# Patient Record
Sex: Male | Born: 1965 | Race: White | Hispanic: No | Marital: Married | Smoking: Never smoker
Health system: Southern US, Community
[De-identification: ages and names within clinical notes are randomized; demographics above are authoritative.]

## PROBLEM LIST (undated history)

## (undated) HISTORY — PX: SHOULDER SURGERY: SHX246

---

## 2008-01-19 ENCOUNTER — Ambulatory Visit: Payer: Self-pay | Admitting: Family Medicine

## 2010-04-17 ENCOUNTER — Ambulatory Visit: Payer: Self-pay

## 2010-09-18 ENCOUNTER — Ambulatory Visit: Payer: Self-pay

## 2011-06-10 ENCOUNTER — Emergency Department: Payer: Self-pay | Admitting: Emergency Medicine

## 2014-06-02 ENCOUNTER — Ambulatory Visit: Payer: 59 | Admitting: Podiatry

## 2014-06-05 ENCOUNTER — Ambulatory Visit: Payer: 59 | Admitting: Podiatry

## 2014-06-19 ENCOUNTER — Ambulatory Visit: Payer: 59 | Admitting: Podiatry

## 2014-06-23 ENCOUNTER — Ambulatory Visit: Payer: 59 | Admitting: Podiatry

## 2014-07-10 ENCOUNTER — Ambulatory Visit: Payer: 59 | Admitting: Podiatry

## 2015-11-09 ENCOUNTER — Emergency Department: Payer: BLUE CROSS/BLUE SHIELD

## 2015-11-09 ENCOUNTER — Emergency Department
Admission: EM | Admit: 2015-11-09 | Discharge: 2015-11-09 | Disposition: A | Discharge status: Home / Self Care | Payer: BLUE CROSS/BLUE SHIELD | Attending: Emergency Medicine | Admitting: Emergency Medicine

## 2015-11-09 DIAGNOSIS — Y92830 Public park as the place of occurrence of the external cause: Secondary | ICD-10-CM | POA: Diagnosis not present

## 2015-11-09 DIAGNOSIS — Y999 Unspecified external cause status: Secondary | ICD-10-CM | POA: Insufficient documentation

## 2015-11-09 DIAGNOSIS — X509XXA Other and unspecified overexertion or strenuous movements or postures, initial encounter: Secondary | ICD-10-CM | POA: Insufficient documentation

## 2015-11-09 DIAGNOSIS — Y9344 Activity, trampolining: Secondary | ICD-10-CM | POA: Insufficient documentation

## 2015-11-09 DIAGNOSIS — S86912A Strain of unspecified muscle(s) and tendon(s) at lower leg level, left leg, initial encounter: Secondary | ICD-10-CM | POA: Diagnosis not present

## 2015-11-09 DIAGNOSIS — S76912A Strain of unspecified muscles, fascia and tendons at thigh level, left thigh, initial encounter: Secondary | ICD-10-CM | POA: Diagnosis not present

## 2015-11-09 DIAGNOSIS — S8992XA Unspecified injury of left lower leg, initial encounter: Secondary | ICD-10-CM | POA: Diagnosis present

## 2015-11-09 DIAGNOSIS — S8392XA Sprain of unspecified site of left knee, initial encounter: Secondary | ICD-10-CM

## 2015-11-09 MED ORDER — IBUPROFEN 600 MG PO TABS: 600.0000 mg | ORAL_TABLET | Freq: Four times a day (QID) | ORAL | Status: AC | PRN

## 2015-11-09 NOTE — Discharge Instructions (Signed)
Elastic Bandage and RICE WHAT DOES AN ELASTIC BANDAGE DO? Elastic bandages come in different shapes and sizes. They generally provide support to your injury and reduce swelling while you are healing, but they can perform different functions. Your health care provider will help you to decide what is best for your protection, recovery, or rehabilitation following an injury. WHAT ARE SOME GENERAL TIPS FOR USING AN ELASTIC BANDAGE?  Use the bandage as directed by the maker of the bandage that you are using.  Do not wrap the bandage too tightly. This may cut off the circulation in the arm or leg in the area below the bandage.  If part of your body beyond the bandage becomes blue, numb, cold, swollen, or is more painful, your bandage is most likely too tight. If this occurs, remove your bandage and reapply it more loosely.  See your health care provider if the bandage seems to be making your problems worse rather than better.  An elastic bandage should be removed and reapplied every 3-4 hours or as directed by your health care provider. WHAT IS RICE? The routine care of many injuries includes rest, ice, compression, and elevation (RICE therapy).  Rest Rest is required to allow your body to heal. Generally, you can resume your routine activities when you are comfortable and have been given permission by your health care provider. Ice Icing your injury helps to keep the swelling down and it reduces pain. Do not apply ice directly to your skin.  Put ice in a plastic bag.  Place a towel between your skin and the bag.  Leave the ice on for 20 minutes, 2-3 times per day. Do this for as long as you are directed by your health care provider. Compression Compression helps to keep swelling down, gives support, and helps with discomfort. Compression may be done with an elastic bandage. Elevation Elevation helps to reduce swelling and it decreases pain. If possible, your injured area should be placed at  or above the level of your heart or the center of your chest. WHEN SHOULD I SEEK MEDICAL CARE? You should seek medical care if:  You have persistent pain and swelling.  Your symptoms are getting worse rather than improving. These symptoms may indicate that further evaluation or further X-rays are needed. Sometimes, X-rays may not show a small broken bone (fracture) until a number of days later. Make a follow-up appointment with your health care provider. Ask when your X-ray results will be ready. Make sure that you get your X-ray results. WHEN SHOULD I SEEK IMMEDIATE MEDICAL CARE? You should seek immediate medical care if:  You have a sudden onset of severe pain at or below the area of your injury.  You develop redness or increased swelling around your injury.  You have tingling or numbness at or below the area of your injury that does not improve after you remove the elastic bandage.   This information is not intended to replace advice given to you by your health care provider. Make sure you discuss any questions you have with your health care provider.   Document Released: 01/06/2002 Document Revised: 04/07/2015 Document Reviewed: 03/02/2014 Elsevier Interactive Patient Education 2016 [employer]  Knee Sprain A knee sprain is a tear in one of the strong, fibrous tissues that connect the bones (ligaments) in your knee. The severity of the sprain depends on how much of the ligament is torn. The tear can be either partial or complete. CAUSES  Often, sprains are a  result of a fall or injury. The force of the impact causes the fibers of your ligament to stretch too much. This excess tension causes the fibers of your ligament to tear. SIGNS AND SYMPTOMS  You may have some loss of motion in your knee. Other symptoms include:  Bruising.  Pain in the knee area.  Tenderness of the knee to the touch.  Swelling. DIAGNOSIS  To diagnose a knee sprain, your health care provider will  physically examine your knee. Your health care provider may also suggest an X-ray exam of your knee to make sure no bones are broken. TREATMENT  If your ligament is only partially torn, treatment usually involves keeping the knee in a fixed position (immobilization) or bracing your knee for activities that require movement for several weeks. To do this, your health care provider will apply a bandage, cast, or splint to keep your knee from moving and to support your knee during movement until it heals. For a partially torn ligament, the healing process usually takes 4-6 weeks. If your ligament is completely torn, depending on which ligament it is, you may need surgery to reconnect the ligament to the bone or reconstruct it. After surgery, a cast or splint may be applied and will need to stay on your knee for 4-6 weeks while your ligament heals. HOME CARE INSTRUCTIONS  Keep your injured knee elevated to decrease swelling.  To ease pain and swelling, apply ice to the injured area:  Put ice in a plastic bag.  Place a towel between your skin and the bag.  Leave the ice on for 20 minutes, 2-3 times a day.  Only take medicine for pain as directed by your health care provider.  Do not leave your knee unprotected until pain and stiffness go away (usually 4-6 weeks).  If you have a cast or splint, do not allow it to get wet. If you have been instructed not to remove it, cover it with a plastic bag when you shower or bathe. Do not swim.  Your health care provider may suggest exercises for you to do during your recovery to prevent or limit permanent weakness and stiffness. SEEK IMMEDIATE MEDICAL CARE IF:  Your cast or splint becomes damaged.  Your pain becomes worse.  You have significant pain, swelling, or numbness below the cast or splint. MAKE SURE YOU:  Understand these instructions.  Will watch your condition.  Will get help right away if you are not doing well or get worse.   This  information is not intended to replace advice given to you by your health care provider. Make sure you discuss any questions you have with your health care provider.   Document Released: 07/17/2005 Document Revised: 08/07/2014 Document Reviewed: 02/26/2013 Elsevier Interactive Patient Education 2016 [employer]  Muscle Strain A muscle strain is an injury that occurs when a muscle is stretched beyond its normal length. Usually a small number of muscle fibers are torn when this happens. Muscle strain is rated in degrees. First-degree strains have the least amount of muscle fiber tearing and pain. Second-degree and third-degree strains have increasingly more tearing and pain.  Usually, recovery from muscle strain takes 1-2 weeks. Complete healing takes 5-6 weeks.  CAUSES  Muscle strain happens when a sudden, violent force placed on a muscle stretches it too far. This may occur with lifting, sports, or a fall.  RISK FACTORS Muscle strain is especially common in athletes.  SIGNS AND SYMPTOMS At the site of the muscle  strain, there may be:  Pain.  Bruising.  Swelling.  Difficulty using the muscle due to pain or lack of normal function. DIAGNOSIS  Your health care provider will perform a physical exam and ask about your medical history. TREATMENT  Often, the best treatment for a muscle strain is resting, icing, and applying cold compresses to the injured area.  HOME CARE INSTRUCTIONS   Use the PRICE method of treatment to promote muscle healing during the first 2-3 days after your injury. The PRICE method involves:  Protecting the muscle from being injured again.  Restricting your activity and resting the injured body part.  Icing your injury. To do this, put ice in a plastic bag. Place a towel between your skin and the bag. Then, apply the ice and leave it on from 15-20 minutes each hour. After the third day, switch to moist heat packs.  Apply compression to the injured area with a  splint or elastic bandage. Be careful not to wrap it too tightly. This may interfere with blood circulation or increase swelling.  Elevate the injured body part above the level of your heart as often as you can.  Only take over-the-counter or prescription medicines for pain, discomfort, or fever as directed by your health care provider.  Warming up prior to exercise helps to prevent future muscle strains. SEEK MEDICAL CARE IF:   You have increasing pain or swelling in the injured area.  You have numbness, tingling, or a significant loss of strength in the injured area. MAKE SURE YOU:   Understand these instructions.  Will watch your condition.  Will get help right away if you are not doing well or get worse.   This information is not intended to replace advice given to you by your health care provider. Make sure you discuss any questions you have with your health care provider.   Document Released: 07/17/2005 Document Revised: 05/07/2013 Document Reviewed: 02/13/2013 Elsevier Interactive Patient Education [employer]

## 2015-11-09 NOTE — ED Notes (Signed)
Pt transported to xray via stretcher

## 2015-11-09 NOTE — ED Notes (Addendum)
Pt to treatment room in wheelchair. Pt states he was at trampoline park with sons earlier and landed on legs, heard pop and legs gave out. Swelling noted to outside upper knee, bruise noted to upper inner thigh. Pt states pain with movement. States pain similar to a muscle tear that he experienced in college.

## 2015-11-09 NOTE — ED Provider Notes (Signed)
[facility] Emergency Department Provider Note  ____________________________________________  Time seen: Approximately 8:00 PM  I have reviewed the triage vital signs and the nursing notes.   HISTORY  Chief Complaint Leg Injury    HPI Blake Chandler is a 50 y.o. male , NAD, presents to the emergency department with a couple of hours history of left thigh and knee pain. He states that he was at a trampoline park with his children where he was trying to "jump as high as he could", when he landed and both of his knees buckled. He heard a pop and struggled to walk to the side of the trampolines.  He states he cannot walk on the left leg with out pain. He states he has pain on the anterior thigh, medial and lateral knee. He placed ice on the left knee and took ibuprofen for the pain with minimal relief.    History reviewed. No pertinent past medical history.  There are no active problems to display for this patient.   Past Surgical History  Procedure Laterality Date  . Shoulder surgery      Current Outpatient Rx  Name  Route  Sig  Dispense  Refill  . ibuprofen (ADVIL,MOTRIN) 600 MG tablet   Oral   Take 1 tablet (600 mg total) by mouth every 6 (six) hours as needed.   30 tablet   0     Allergies Review of patient's allergies indicates no known allergies.  No family history on file.  Social History Social History  Substance Use Topics  . Smoking status: Never Smoker   . Smokeless tobacco: None  . Alcohol Use: No     Review of Systems  Constitutional: No fever/chills, fatigue Eyes: No visual changes.  Cardiovascular: No chest pain. Respiratory: No cough. No shortness of breath. No wheezing.  Gastrointestinal: No abdominal pain.  No nausea, vomiting.   Musculoskeletal: Positive for left quadriceps and knee pain. Negative for back and neck pain.  Skin: Positive knee swelling. Negative for rash, open wounds. Neurological: Negative for headaches,  focal weakness or numbness. No saddle paresthesias. No tingling. 10-point ROS otherwise negative.  ____________________________________________   PHYSICAL EXAM:  VITAL SIGNS: ED Triage Vitals  Enc Vitals Group     BP 11/09/15 1945 130/100 mmHg     Pulse Rate 11/09/15 1945 82     Resp 11/09/15 1945 16     Temp 11/09/15 1945 98.8 F (37.1 C)     Temp Source 11/09/15 1945 Oral     SpO2 11/09/15 1945 94 %     Weight 11/09/15 1945 180 lb (81.647 kg)     Height 11/09/15 1945  (1.778 m)     Head Cir --      Peak Flow --      Pain Score 11/09/15 1946 7     Pain Loc --      Pain Edu? --      Excl. in GC? --     Constitutional: Alert and oriented. Well appearing and in no acute distress. Head: Atraumatic. Cardiovascular: Good peripheral circulation with 2+ pulses noted in the left lower extremity. Respiratory: Normal respiratory effort without tachypnea or retractions. Musculoskeletal: No deformity noted. Mild edema noted on the medial and superior sides of the left knee. TTP about the left quadriceps diffusely. Full passive ROM with mild pain to the left quadriceps. Negative bilateral straight leg raise. Neurologic:  Normal speech and language. No gross focal neurologic deficits are appreciated.  Skin: Mild ecchymosis noted on the left lateral knee.  Skin is warm, dry and intact. No rash noted. Psychiatric: Mood and affect are normal. Speech and behavior are normal. Patient exhibits appropriate insight and judgement.   ____________________________________________   LABS  None  ____________________________________________  RADIOLOGY I have personally viewed and evaluated these images (plain radiographs) as part of my medical decision making, as well as reviewing the written report by the radiologist.  Dg Pelvis 1-2 Views  11/09/2015  CLINICAL DATA:  Pain following running, initial encounter EXAM: PELVIS - 1-2 VIEW COMPARISON:  None. FINDINGS: There is no evidence of  pelvic fracture or diastasis. No pelvic bone lesions are seen. IMPRESSION: No acute abnormality noted. Electronically Signed   By: Alcide CleverMark  Lukens M.D.   On: 11/09/2015 20:57   Dg Knee Complete 4 Views Left  11/09/2015  CLINICAL DATA:  Knee pain following running, initial encounter EXAM: LEFT KNEE - COMPLETE 4+ VIEW COMPARISON:  None. FINDINGS: No acute fracture or dislocation is noted. There is soft tissue swelling and thickening of the suprapatellar ligament raising suspicion of injury. No other soft tissue abnormality is noted. IMPRESSION: Possible suprapatellar ligament injury. No acute bony abnormality is seen. Electronically Signed   By: Alcide CleverMark  Lukens M.D.   On: 11/09/2015 20:57   Dg Femur Min 2 Views Left  11/09/2015  CLINICAL DATA:  Left leg pain following running, initial encounter EXAM: LEFT FEMUR 2 VIEWS COMPARISON:  None. FINDINGS: No acute bony abnormality is noted. There is again soft tissue swelling in the suprapatellar region suggestive of ligamentous injury. IMPRESSION: Soft tissue changes along the suprapatellar region. No bony abnormality is seen. Electronically Signed   By: Alcide CleverMark  Lukens M.D.   On: 11/09/2015 20:58    ____________________________________________    PROCEDURES  Procedure(s) performed: None   Medications - No data to display   ____________________________________________   INITIAL IMPRESSION / ASSESSMENT AND PLAN / ED COURSE  Pertinent imaging results that were available during my care of the patient were reviewed by me and considered in my medical decision making (see chart for details).  Patient's diagnosis is consistent with left thigh and knee sprain. Patient will be discharged home with prescriptions for ibuprofen 600mg  to take as directed. Patient's left knee and thigh wrapped in an Ace wrap and he was given crutches to assist with ambulation. Patient is to follow up with Dr. Joice LoftsPoggi in orthopedics for continued assessment and management. Patient is to  follow up with PCP if symptoms persist past this treatment course. Patient is given ED precautions to return to the ED for any worsening or new symptoms.      ____________________________________________  FINAL CLINICAL IMPRESSION(S) / ED DIAGNOSES  Final diagnoses:  Muscle strain of left thigh, initial encounter  Knee sprain and strain, left, initial encounter      NEW MEDICATIONS STARTED DURING THIS VISIT:  Discharge Medication List as of 11/09/2015  9:20 PM    START taking these medications   Details  ibuprofen (ADVIL,MOTRIN) 600 MG tablet Take 1 tablet (600 mg total) by mouth every 6 (six) hours as needed., Starting 11/09/2015, Until Discontinued, Print             Hope PigeonJami L Sejla Marzano, PA-C 11/09/15 2153  Phineas SemenGraydon Goodman, MD 11/11/15 1541

## 2015-11-09 NOTE — ED Notes (Signed)
Pt arrives to ER c/o left leg pain. Pt at trampoline park P[place] and injured upper and outer left leg. Pt unable to lift leg. No obvious deformity. Pt alert and oriented X4, active, cooperative, pt in NAD. RR even and unlabored, color WNL.

## 2016-02-28 ENCOUNTER — Ambulatory Visit (INDEPENDENT_AMBULATORY_CARE_PROVIDER_SITE_OTHER): Payer: BLUE CROSS/BLUE SHIELD | Admitting: Sports Medicine

## 2016-02-28 ENCOUNTER — Encounter: Payer: Self-pay | Admitting: Sports Medicine

## 2016-02-28 DIAGNOSIS — S76112A Strain of left quadriceps muscle, fascia and tendon, initial encounter: Secondary | ICD-10-CM | POA: Insufficient documentation

## 2016-02-28 NOTE — Assessment & Plan Note (Signed)
Patient is here with complaints of continued pain/discomfort after suffering a 2+ quadriceps muscle tear. Ultrasound yielded evidence of significant hematoma still present deep to the rectus femoris, lateral to the midline. No evidence of quadriceps tendon compromise. - Encouraged improve compliance with physical therapy. - Fitted for left thigh compression sleeve.

## 2016-02-28 NOTE — Progress Notes (Signed)
   HPI  CC: Left thigh pain Patient is here with complaints of continued left thigh pain. He states that back in April he suffered an injury while at the trampoline park with his 2 sons. At that time he had experienced a sharp popping sensation in his left thigh followed by severe pain. He states that he had some initial swelling and ecchymoses of this area. He was seen at [facility] orthopedics and an MRI was performed. According to the patient that MRI revealed no abnormalities within his knee however it showed a 2+ quadricep tear.  Since that time patient had been referred to physical therapy. He had undergone 2 sessions of physical therapy but then stopped showing up due to beliefs that he could carry on those exercises at home.  Patient continues to experience pain in the anterior lateral aspect of his left thigh. He also endorses concern for atrophy of the affected thigh. He experiences some occasional "buckling" of the affected leg especially when using stairs. The initial swelling and ecchymoses has since resolved.  Review of Systems   See HPI for ROS. All other systems reviewed and are negative.  CC, SH/smoking status, and VS noted  Objective: BP 120/78   Pulse 62   Ht 5\' 11"  (1.803 m)   Wt 184 lb (83.5 kg)   BMI 25.66 kg/m  Gen: NAD, alert, cooperative, and pleasant. Knee/Thigh, Left:  Knee demonstrates full range of motion. No effusion.  Good ligamentous stability. Extensor mechanism is intact. There are 2 separate areas of palpable induration along the lateral thigh. There is one area in the mid thigh and a separate smaller area a little more distal just superior to the patella.  These areas are slightly tender to palpation. There is some slight atrophy of the left thigh when compared to the right. He is neurovascularly intact distally.  Ultrasound: Left Knee and Thigh: Limited US performed.Quadriceps and patellar tendon are intact without abnormalities. Minimal compressible  effusion noted in suprapatellar pouch. Medial and lateral meniscus visualized and without obvious abnormalities. Notable hypoechoic pocket visualized deep to the rectus femoris just lateral to the midline of the thigh involving the vastus lateralis at this level. There is a smaller yet identical area a little more distally.  These changes are consistent with resolving hematomas likely as the result of a partial quad tear here.   Assessment and plan:  Strain of left quadriceps muscle Patient is here with complaints of continued pain/discomfort after suffering a 2+ quadriceps muscle tear. Ultrasound yielded evidence of significant hematoma still present deep to the rectus femoris, lateral to the midline. No evidence of quadriceps tendon compromise. - Encouraged improve compliance with physical therapy. - Fitted for left thigh compression sleeve.   Kathee Delton, MD,MS,  PGY3 02/28/2016 10:16 AM    Patient was seen and evaluated with the resident. I agree with the above plan of care. Ultrasound evaluation reveals 2 separate areas of hematoma within the vastus lateralis muscle. I explained to the patient that it may be another 3 months or so before hematoma resolution is seen. I recommended compression with activity and a return to physical therapy. Patient will follow-up with Dr. Thurston Hole per Dr. Sherene Sires request. Follow-up with [place] as needed.

## 2017-03-26 ENCOUNTER — Ambulatory Visit: Payer: BLUE CROSS/BLUE SHIELD | Admitting: Sports Medicine

## 2017-08-15 IMAGING — CR DG PELVIS 1-2V
1 series · 1 of 1 positions shown · non-contrast
Comparison: None.

CLINICAL DATA: Pain following running, initial encounter

EXAM:
PELVIS - 1-2 VIEW

[dg pelvis 1-2 views]
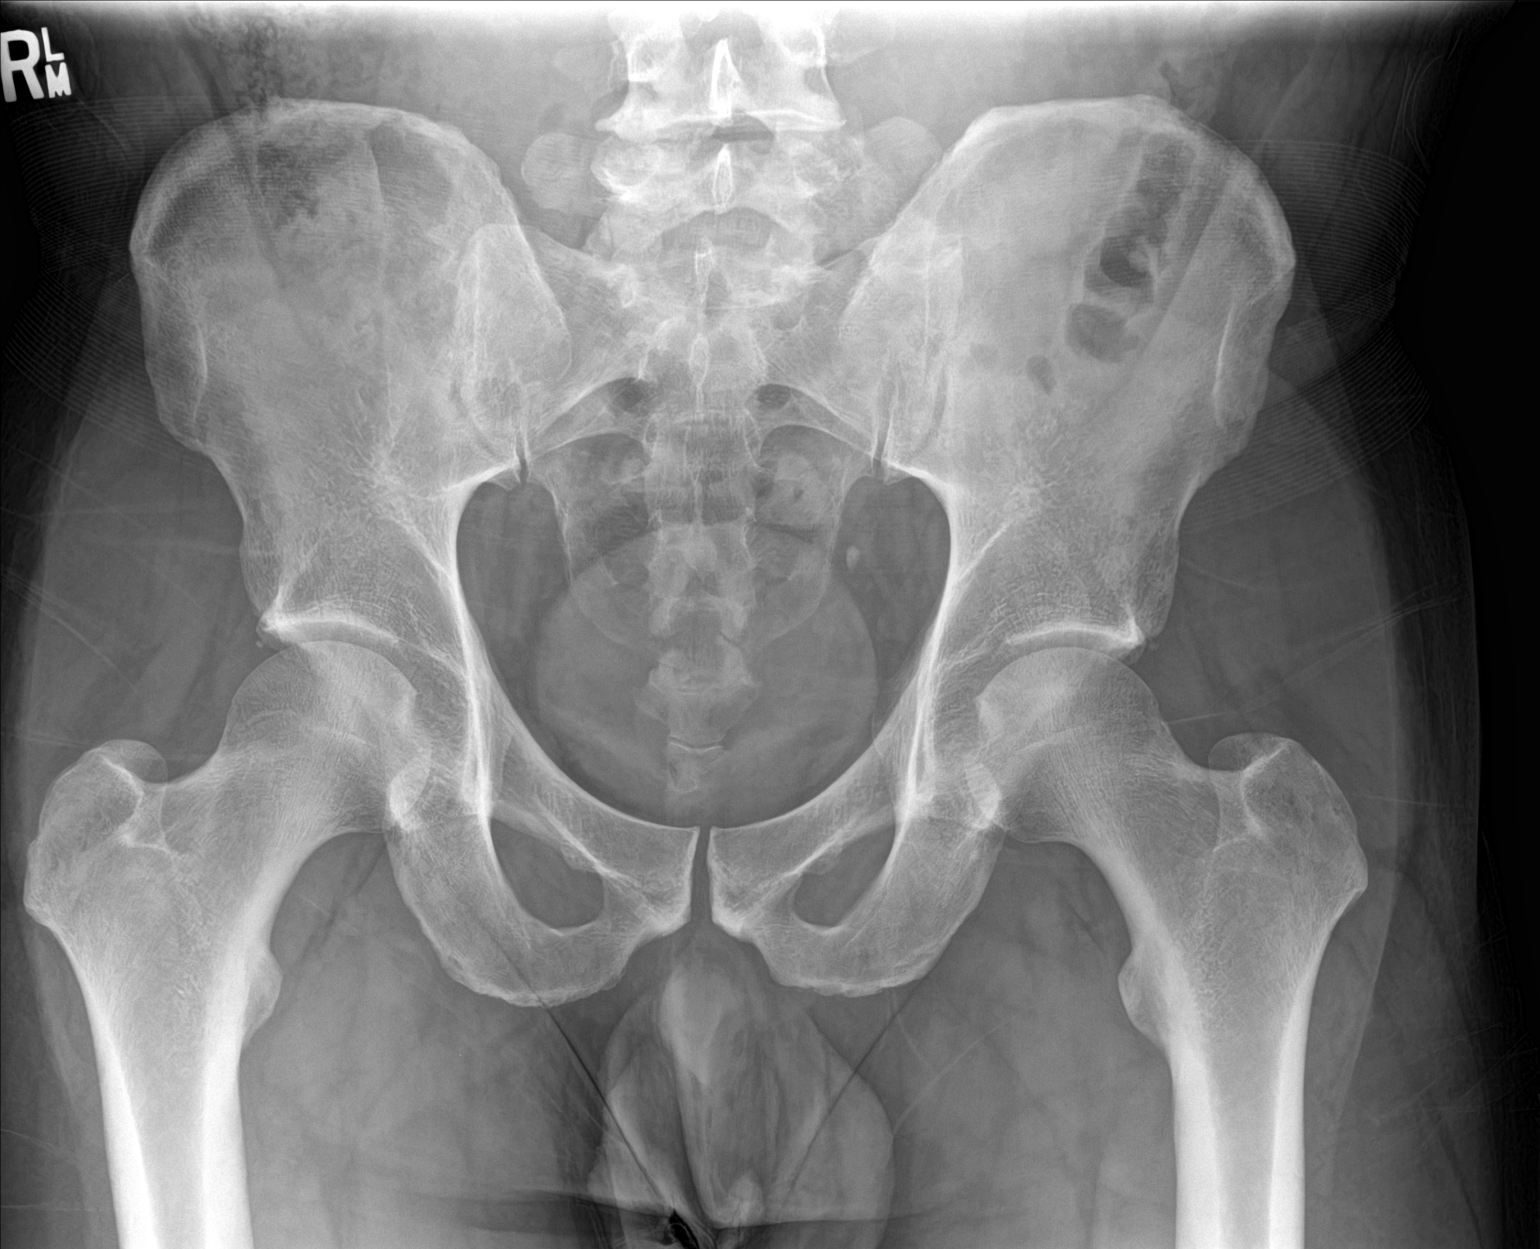

[1 of 1 positions shown; findings below may reference images not displayed]

FINDINGS: There is no evidence of pelvic fracture or diastasis. No pelvic bone
lesions are seen.
IMPRESSION: No acute abnormality noted.

## 2017-08-15 IMAGING — CR DG FEMUR 2+V*L*
1 series · 4 of 4 positions shown · non-contrast
Comparison: None.

CLINICAL DATA: Left leg pain following running, initial encounter

EXAM:
LEFT FEMUR 2 VIEWS

[Series 1: dg femur min 2 views left · 0.14mm/px · 4 of 4 slices shown]
[im 1/4]
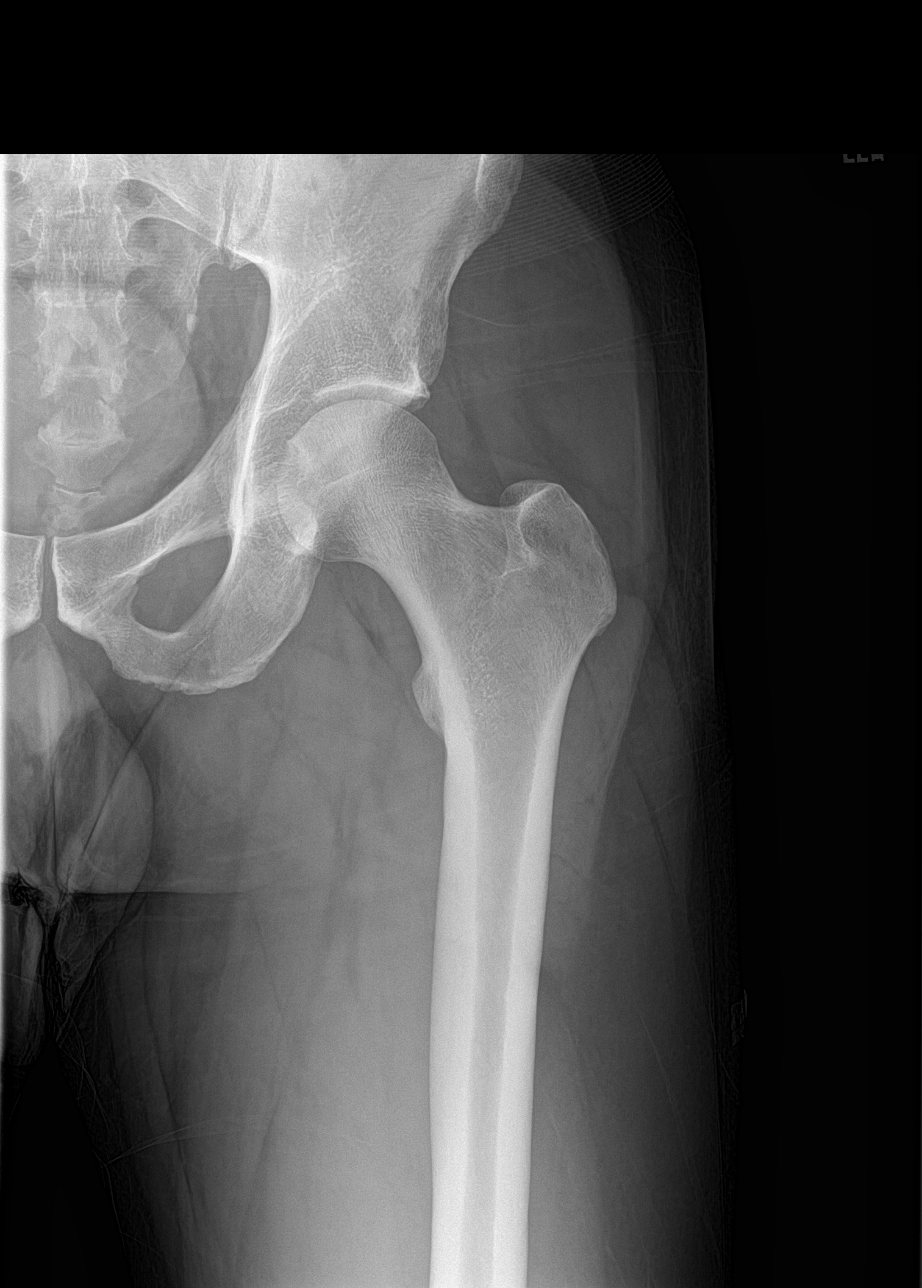
[im 2/4]
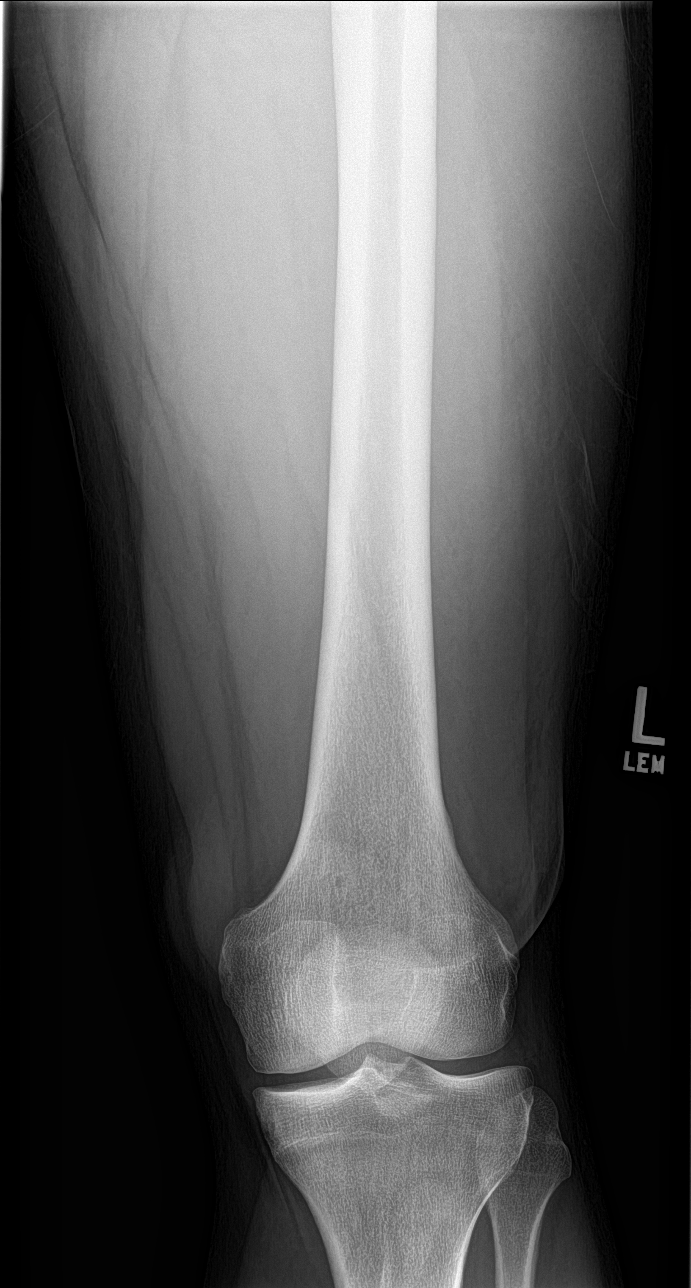
[im 3/4]
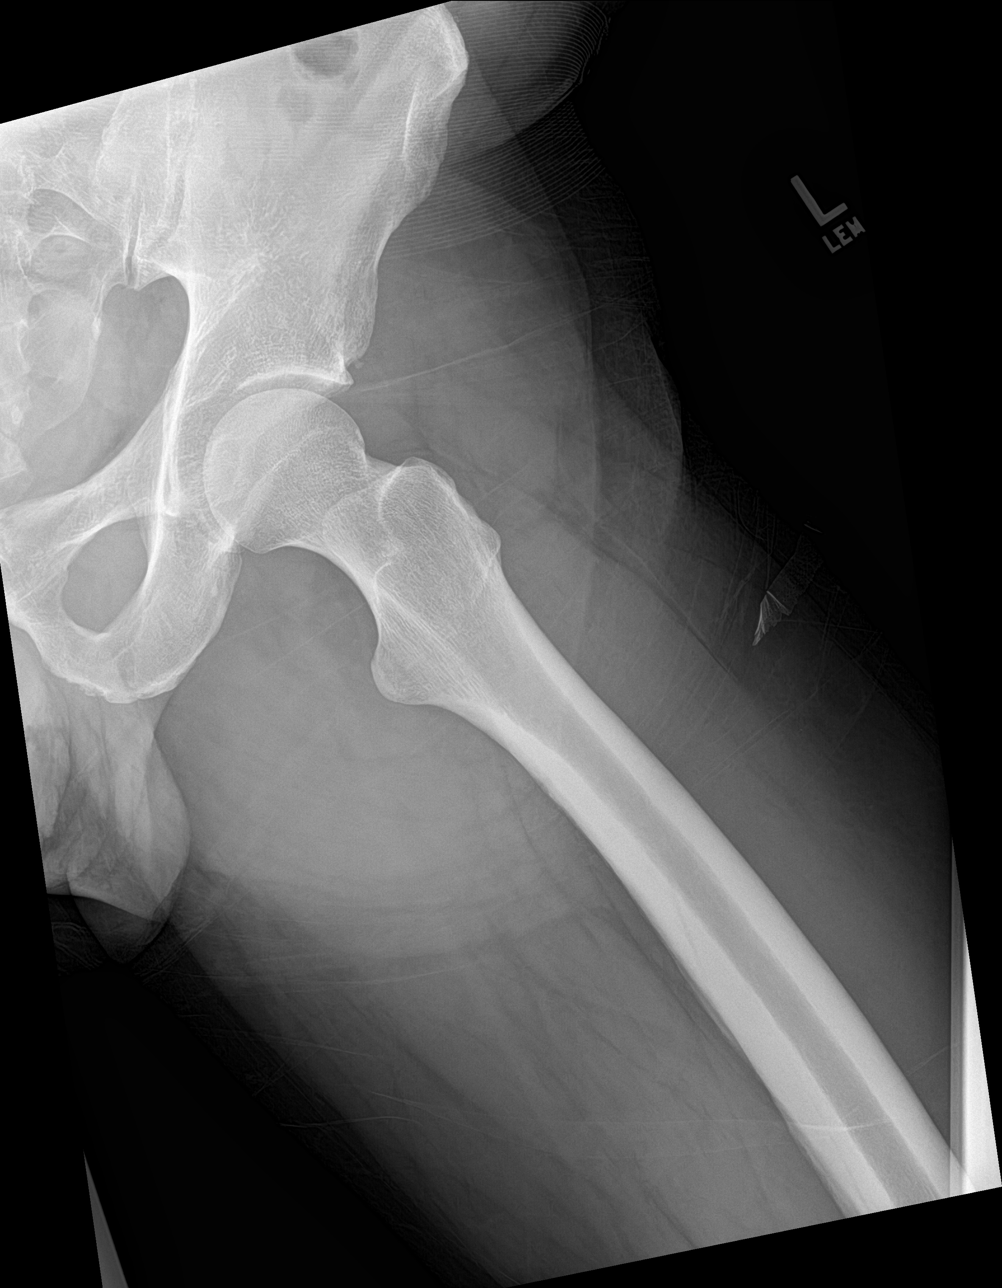
[im 4/4]
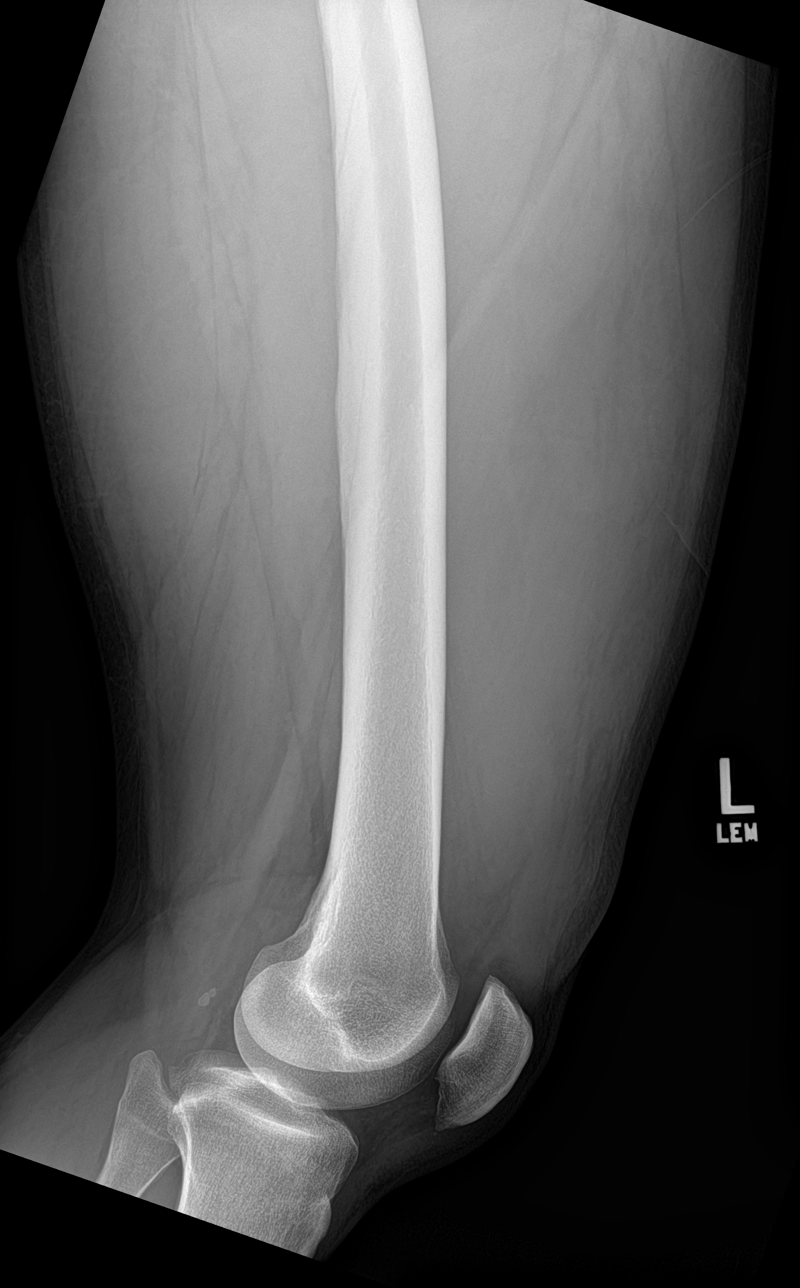

[4 of 4 positions shown; findings below may reference images not displayed]

FINDINGS: No acute bony abnormality is noted. There is again soft tissue
swelling in the suprapatellar region suggestive of ligamentous
injury.
IMPRESSION: Soft tissue changes along the suprapatellar region. No bony
abnormality is seen.

## 2017-08-15 IMAGING — CR DG KNEE COMPLETE 4+V*L*
1 series · 4 of 4 positions shown · non-contrast
Comparison: None.

CLINICAL DATA: Knee pain following running, initial encounter

EXAM:
LEFT KNEE - COMPLETE 4+ VIEW

[Series 1: dg knee complete 4 views left · 0.14mm/px · 4 of 4 slices shown]
[im 1/4]
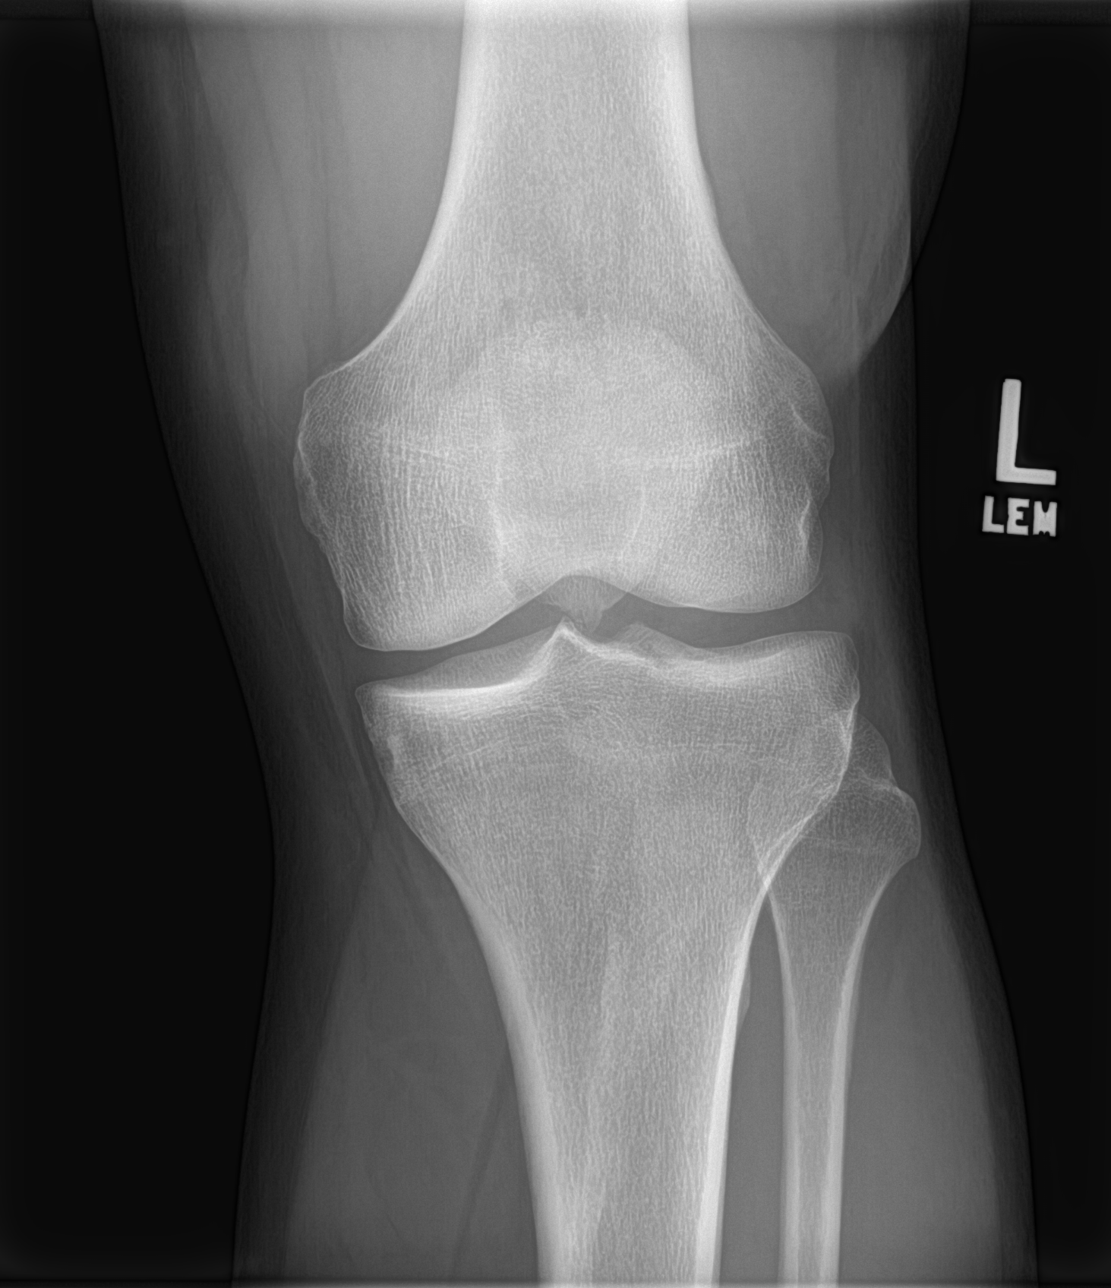
[im 2/4]
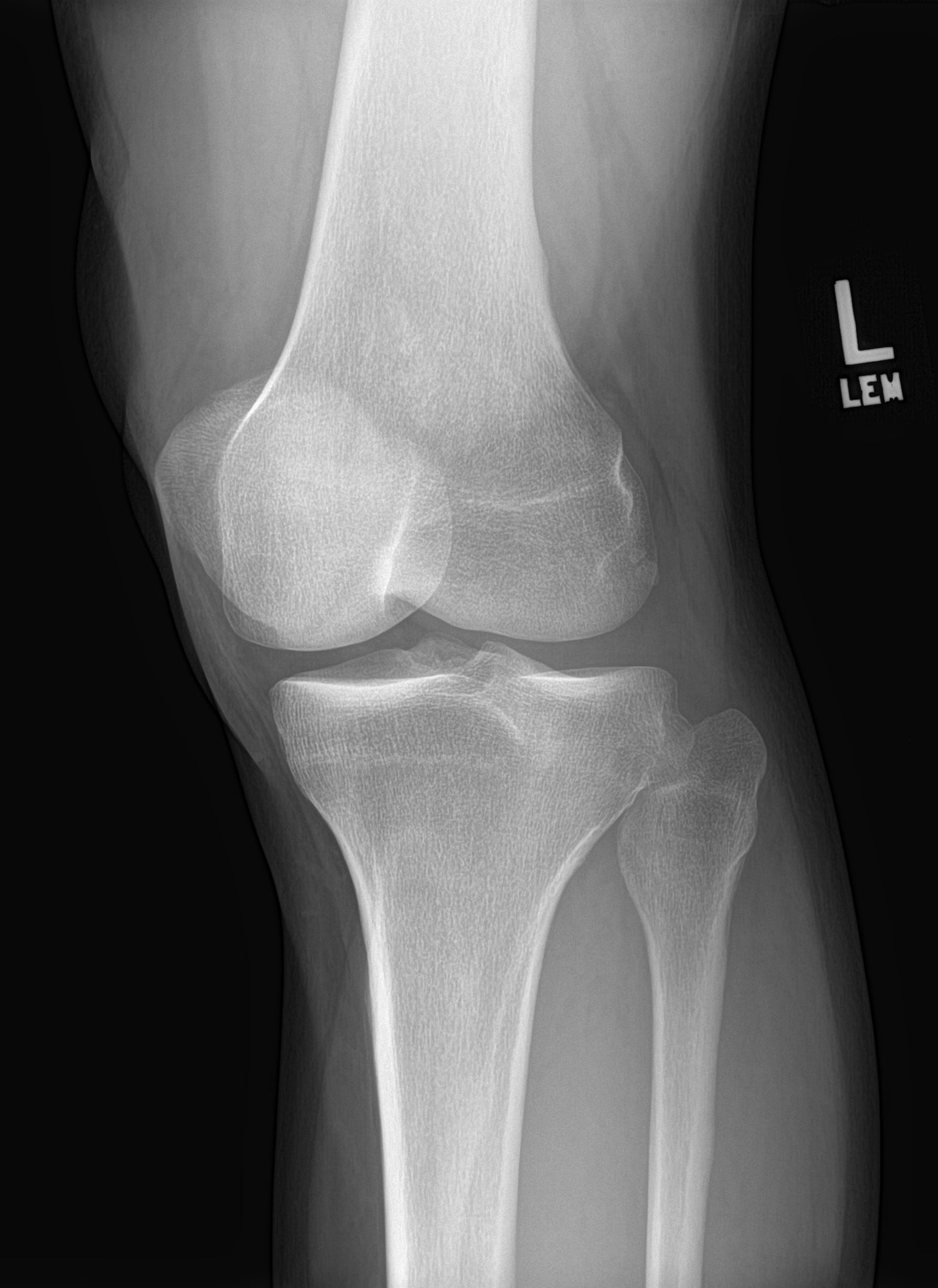
[im 3/4]
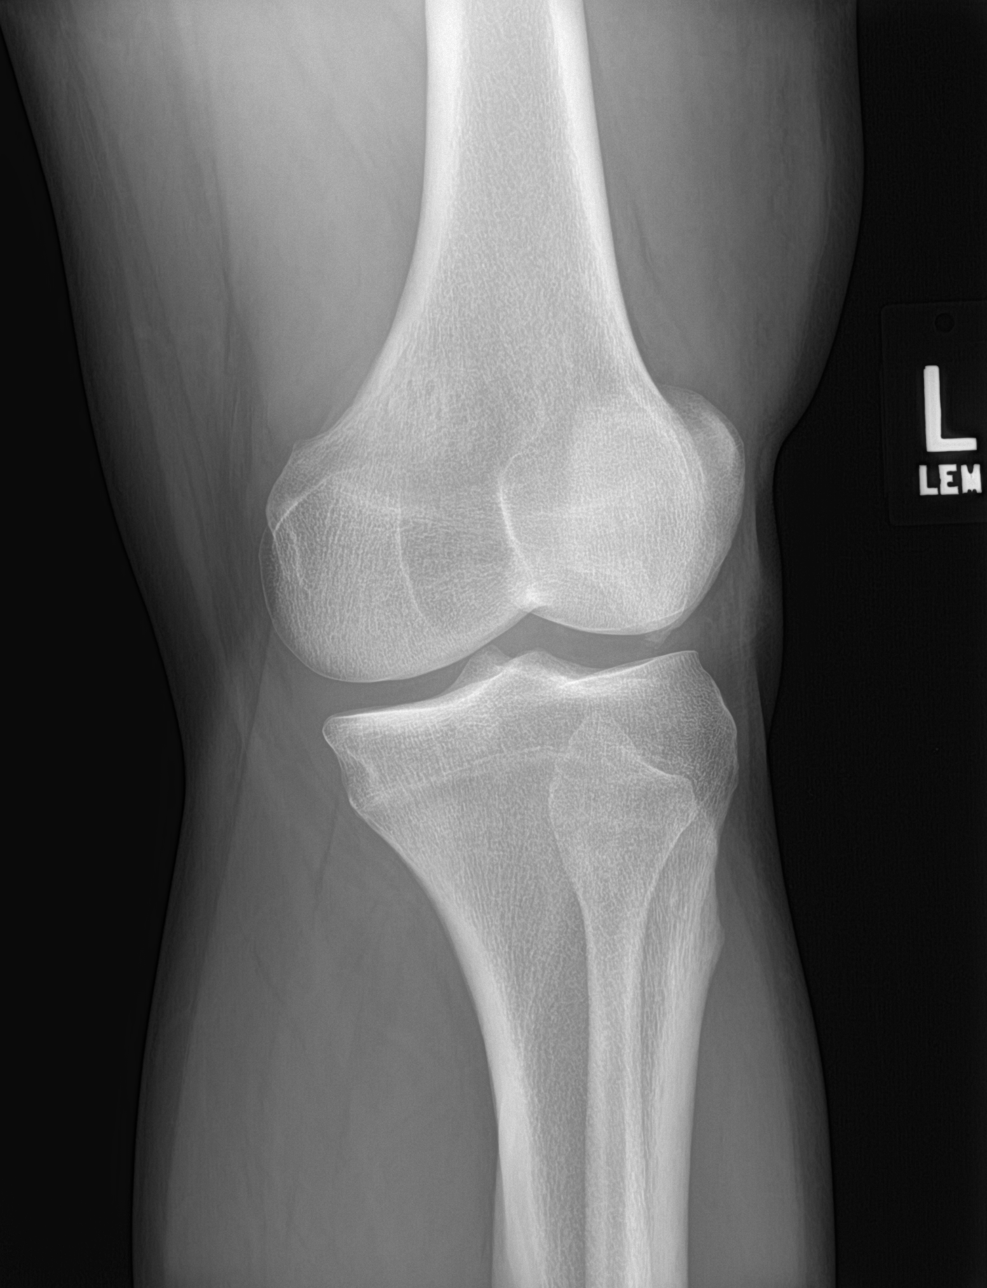
[im 4/4]
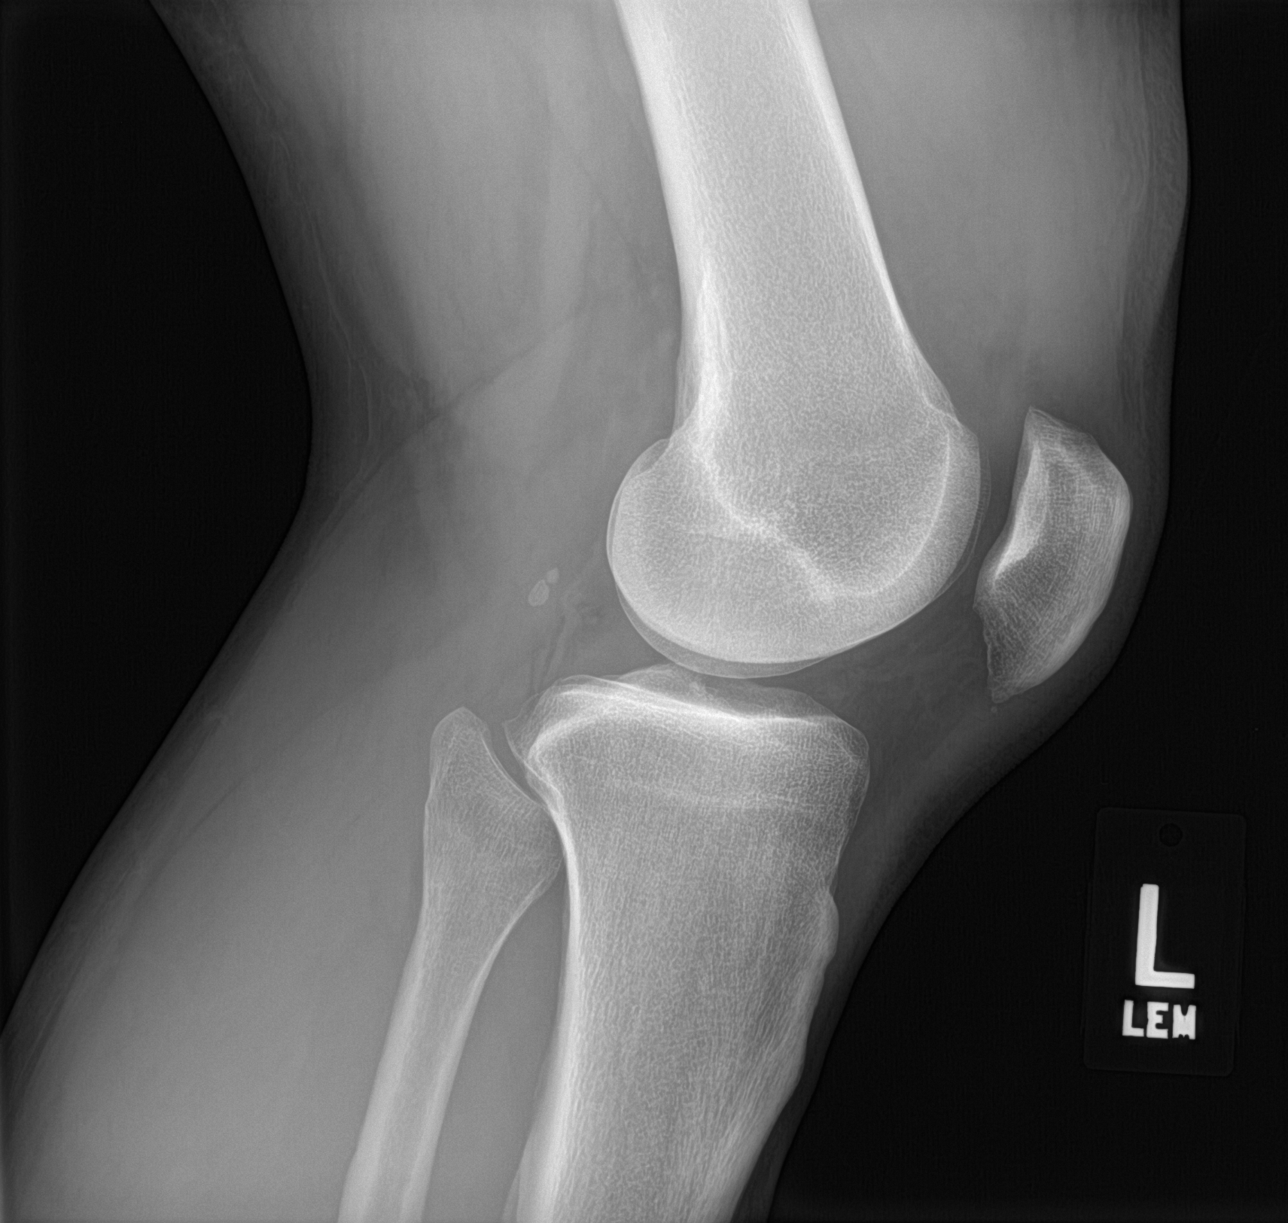

[4 of 4 positions shown; findings below may reference images not displayed]

FINDINGS: No acute fracture or dislocation is noted. There is soft tissue
swelling and thickening of the suprapatellar ligament raising
suspicion of injury. No other soft tissue abnormality is noted.
IMPRESSION: Possible suprapatellar ligament injury. No acute bony abnormality is
seen.
